# Patient Record
Sex: Male | Born: 1940 | Hispanic: No | Marital: Married
Health system: Southern US, Community
[De-identification: ages and names within clinical notes are randomized; demographics above are authoritative.]

---

## 2004-02-15 ENCOUNTER — Ambulatory Visit: Payer: Self-pay | Admitting: Otolaryngology

## 2004-05-12 ENCOUNTER — Ambulatory Visit: Payer: Self-pay

## 2004-10-19 ENCOUNTER — Ambulatory Visit: Payer: Self-pay

## 2004-11-22 ENCOUNTER — Ambulatory Visit: Payer: Self-pay | Admitting: Otolaryngology

## 2007-03-25 ENCOUNTER — Inpatient Hospital Stay: Payer: Self-pay | Admitting: Surgery

## 2008-12-27 ENCOUNTER — Emergency Department: Payer: Self-pay | Admitting: Emergency Medicine

## 2008-12-30 ENCOUNTER — Ambulatory Visit: Payer: Self-pay | Admitting: Urology

## 2009-01-07 ENCOUNTER — Ambulatory Visit: Payer: Self-pay | Admitting: Urology

## 2009-01-07 ENCOUNTER — Ambulatory Visit: Payer: Self-pay | Admitting: Internal Medicine

## 2009-01-14 ENCOUNTER — Ambulatory Visit: Payer: Self-pay | Admitting: Urology

## 2009-01-19 ENCOUNTER — Inpatient Hospital Stay: Payer: Self-pay | Admitting: Urology

## 2009-01-19 ENCOUNTER — Ambulatory Visit: Payer: Self-pay | Admitting: Urology

## 2009-01-26 ENCOUNTER — Ambulatory Visit: Payer: Self-pay | Admitting: Urology

## 2009-02-04 ENCOUNTER — Ambulatory Visit: Payer: Self-pay | Admitting: Urology

## 2009-02-18 ENCOUNTER — Ambulatory Visit: Payer: Self-pay | Admitting: Urology

## 2009-06-28 ENCOUNTER — Ambulatory Visit: Payer: Self-pay | Admitting: Urology

## 2009-08-27 ENCOUNTER — Ambulatory Visit: Payer: Self-pay | Admitting: Urology

## 2009-09-09 ENCOUNTER — Ambulatory Visit: Payer: Self-pay | Admitting: Urology

## 2009-09-11 ENCOUNTER — Emergency Department: Payer: Self-pay | Admitting: Emergency Medicine

## 2009-09-14 ENCOUNTER — Ambulatory Visit: Payer: Self-pay | Admitting: Urology

## 2009-09-20 ENCOUNTER — Ambulatory Visit: Payer: Self-pay | Admitting: Urology

## 2009-09-20 ENCOUNTER — Observation Stay: Payer: Self-pay | Admitting: Urology

## 2010-07-26 IMAGING — CR DG ABDOMEN 1V
1 series · 1 of 1 positions shown · non-contrast
Comparison: none

REASON FOR EXAM: nephrolithiasis
COMMENTS:

[view not recorded]
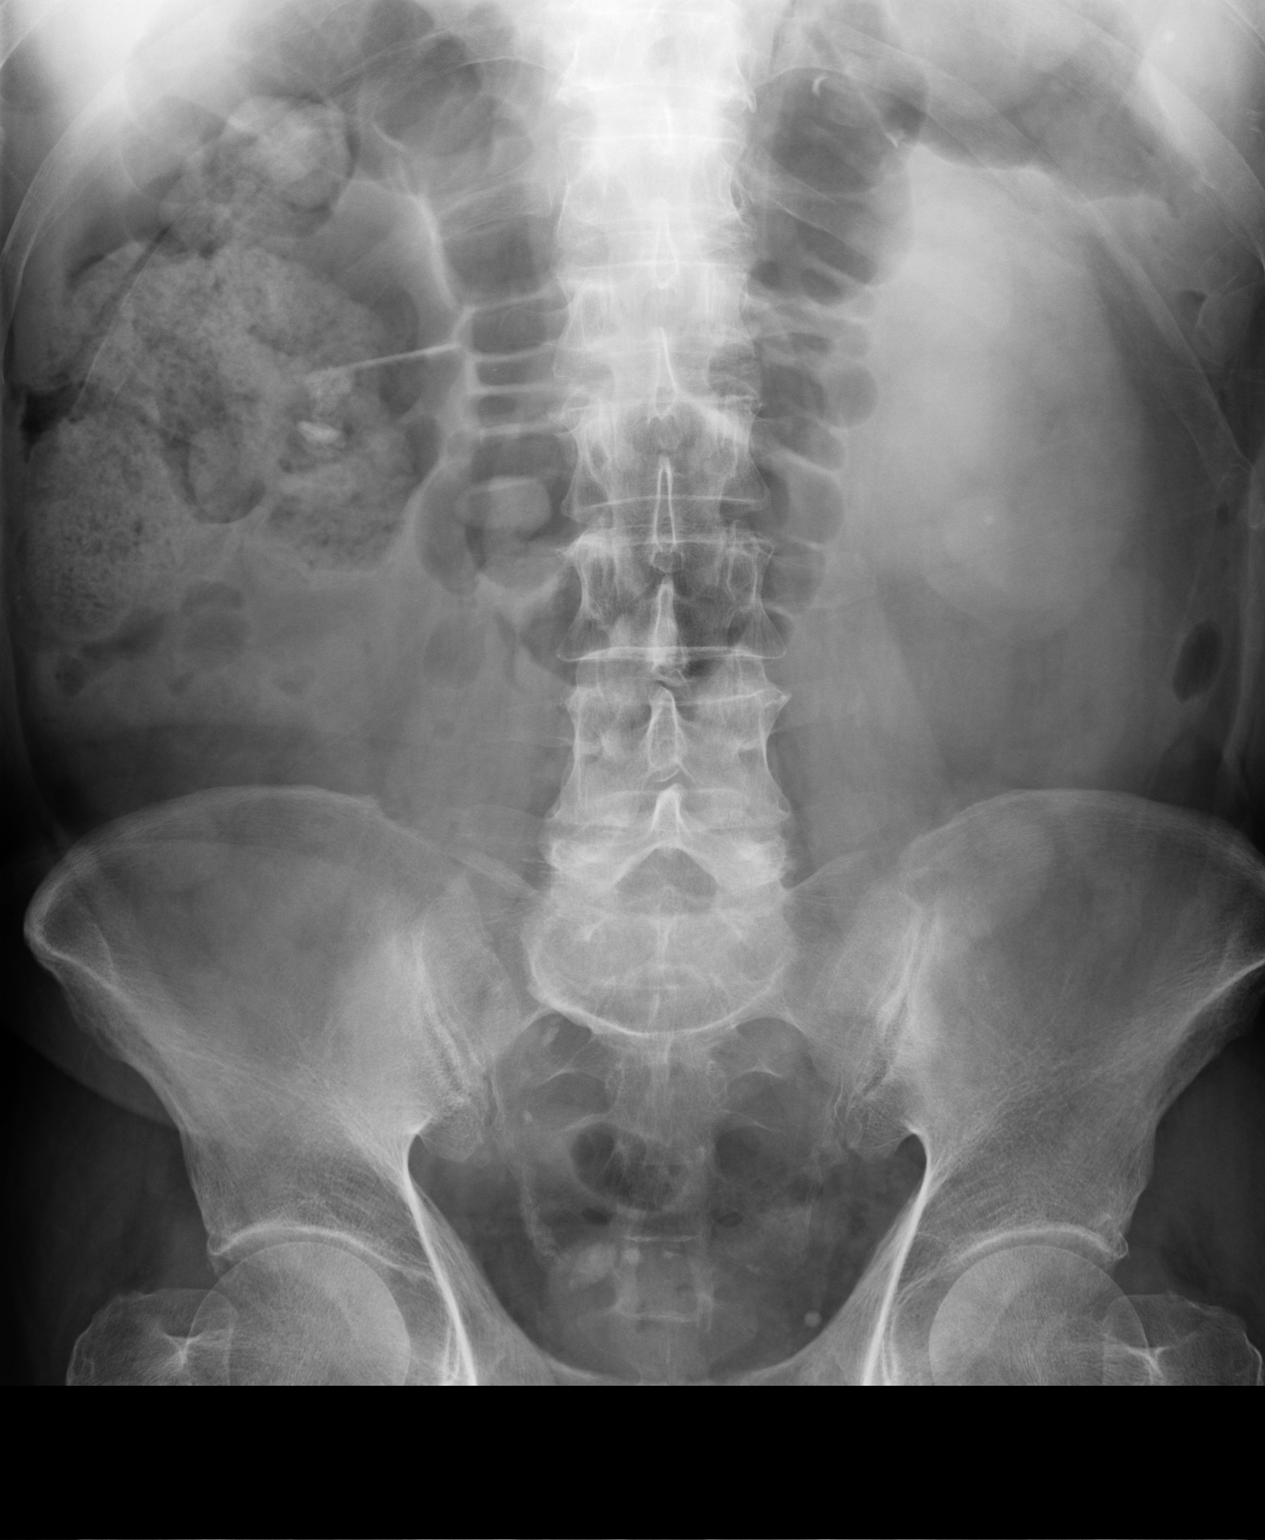

[1 of 1 positions shown; findings below may reference images not displayed]

PROCEDURE:     DXR - DXR KIDNEY URETER BLADDER  - January 19, 2009  [DATE]

RESULT:     Comparison is made to the study 14 January, 2009. There is
again demonstrated calcification projecting over the right kidney likely in
the renal pelvis region. This calcification may be in 2 parts currently.
There is a dense structure that lies adjacent to the L2 correction L2-3 disc
space on the right that may reflect density within the colon or could
reflect a urinary tract stone. There is radiodense material within the
pelvis and there may be Steinstrasse in the distal ureter.
IMPRESSION: There are calcifications that project over the right kidney
and along the expected course of the ureter. Some of these calcifications
lie may lie within bowel. There is likely Steinstrasse in the distal right
ureter. Further interpretation is deferred to Dr. Tiger.

## 2010-08-02 IMAGING — CR DG ABDOMEN 1V
1 series · 1 of 1 positions shown · non-contrast
Comparison: none

REASON FOR EXAM: nephrolithiasis 
COMMENTS:

[view not recorded]
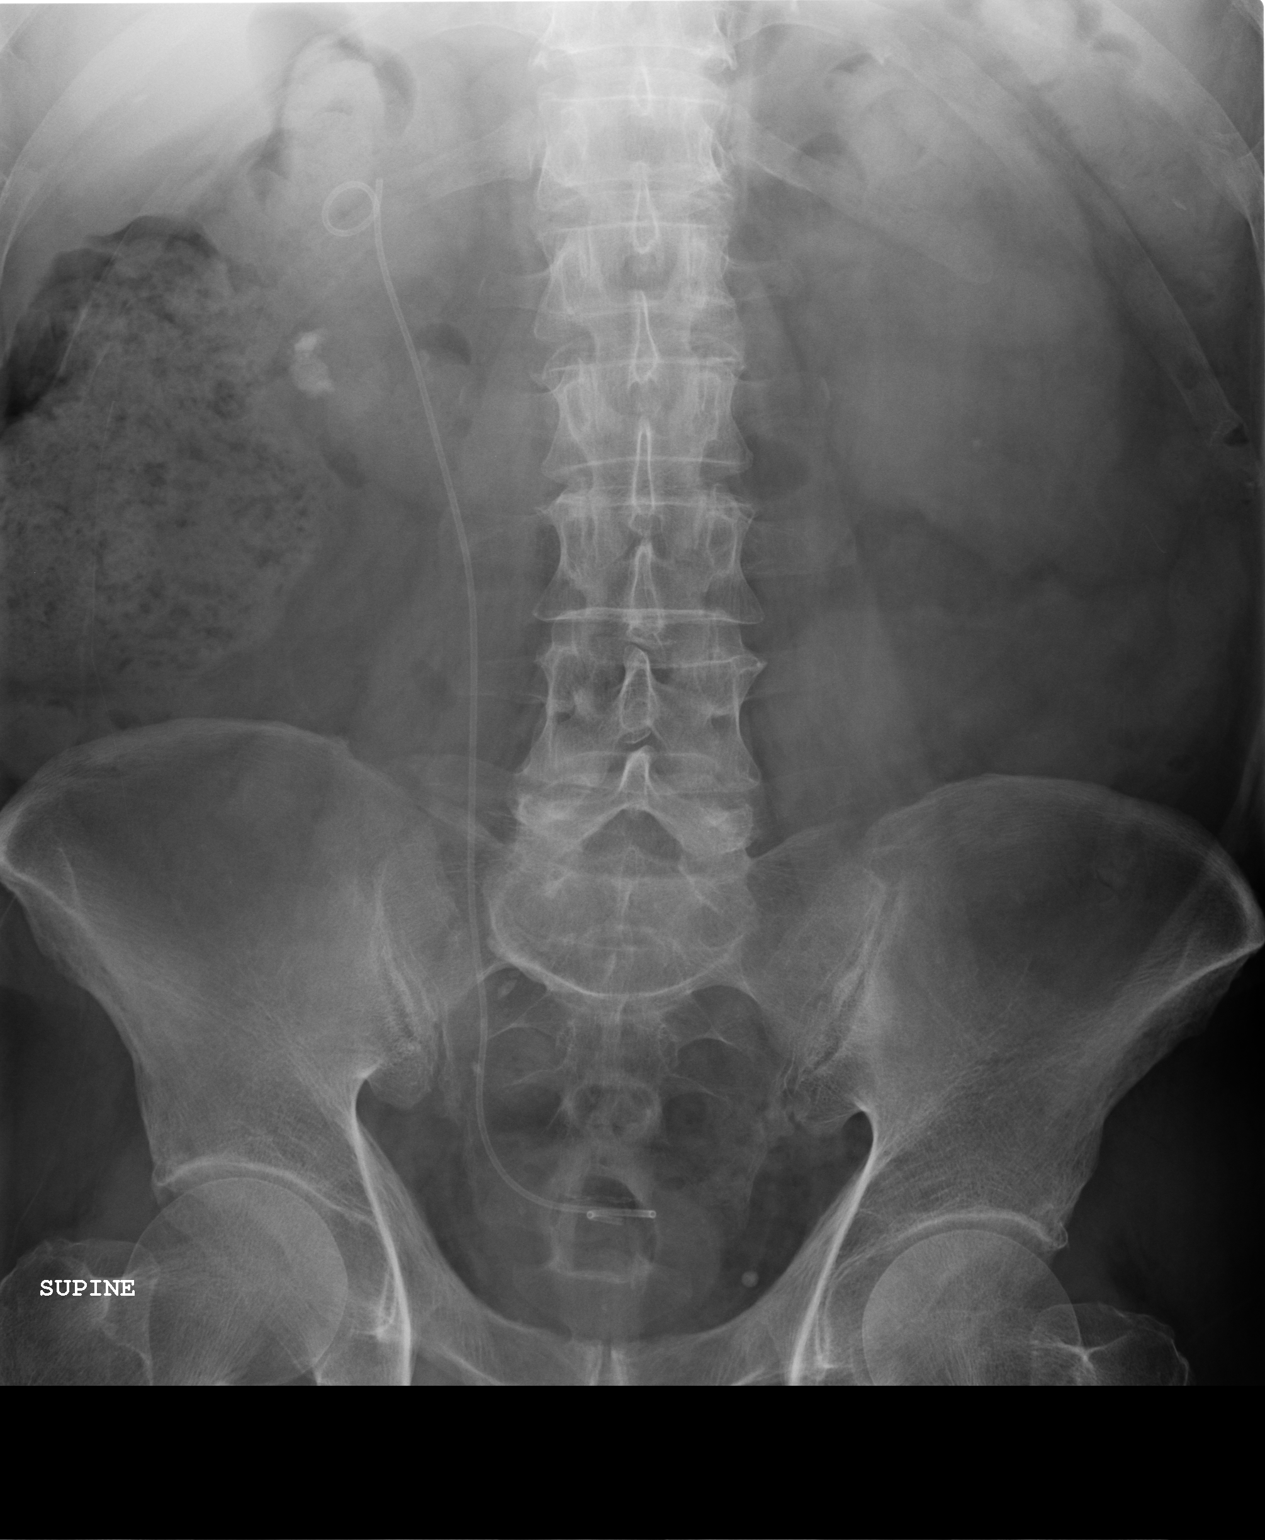

[1 of 1 positions shown; findings below may reference images not displayed]

PROCEDURE:     DXR - DXR KIDNEY URETER BLADDER  - January 26, 2009  [DATE]

RESULT:     Comparison is made to the study 19 January, 2009.

There has been interval placement of a double-J ureteral stent on the right.
A coarse calcification remains visible over the lower pole of the right
kidney. There is a phlebolith in the left aspect of the pelvis and there is
a calcification that overlies the pelvic portion of the stent on the right.
The area of increased density previously demonstrated in the expected
location of the lower ureter is not evident today and the tiny stone
fragments may have been removed.

There is approximately 2 mm diameter calcification in the lower pole of the
left kidney which appears stable.
IMPRESSION: There are bilateral kidney stones. A double-J ureteral
stent has been placed on the right. Further interpretation is deferred to
Dr. Bastl.

## 2010-10-31 ENCOUNTER — Ambulatory Visit: Payer: Self-pay | Admitting: Urology

## 2010-11-21 ENCOUNTER — Ambulatory Visit: Payer: Self-pay | Admitting: Urology

## 2010-11-29 ENCOUNTER — Ambulatory Visit: Payer: Self-pay | Admitting: Urology

## 2010-12-09 ENCOUNTER — Ambulatory Visit: Payer: Self-pay | Admitting: Urology

## 2010-12-16 ENCOUNTER — Ambulatory Visit: Payer: Self-pay | Admitting: Urology

## 2011-03-27 IMAGING — CR DG ABDOMEN 1V
1 series · 2 of 2 positions shown · non-contrast
Comparison: none

REASON FOR EXAM: nephrolithaisis
COMMENTS:

[Series 1: view not recorded · 0.17mm/px · 2 of 2 slices shown]
[im 1/2]
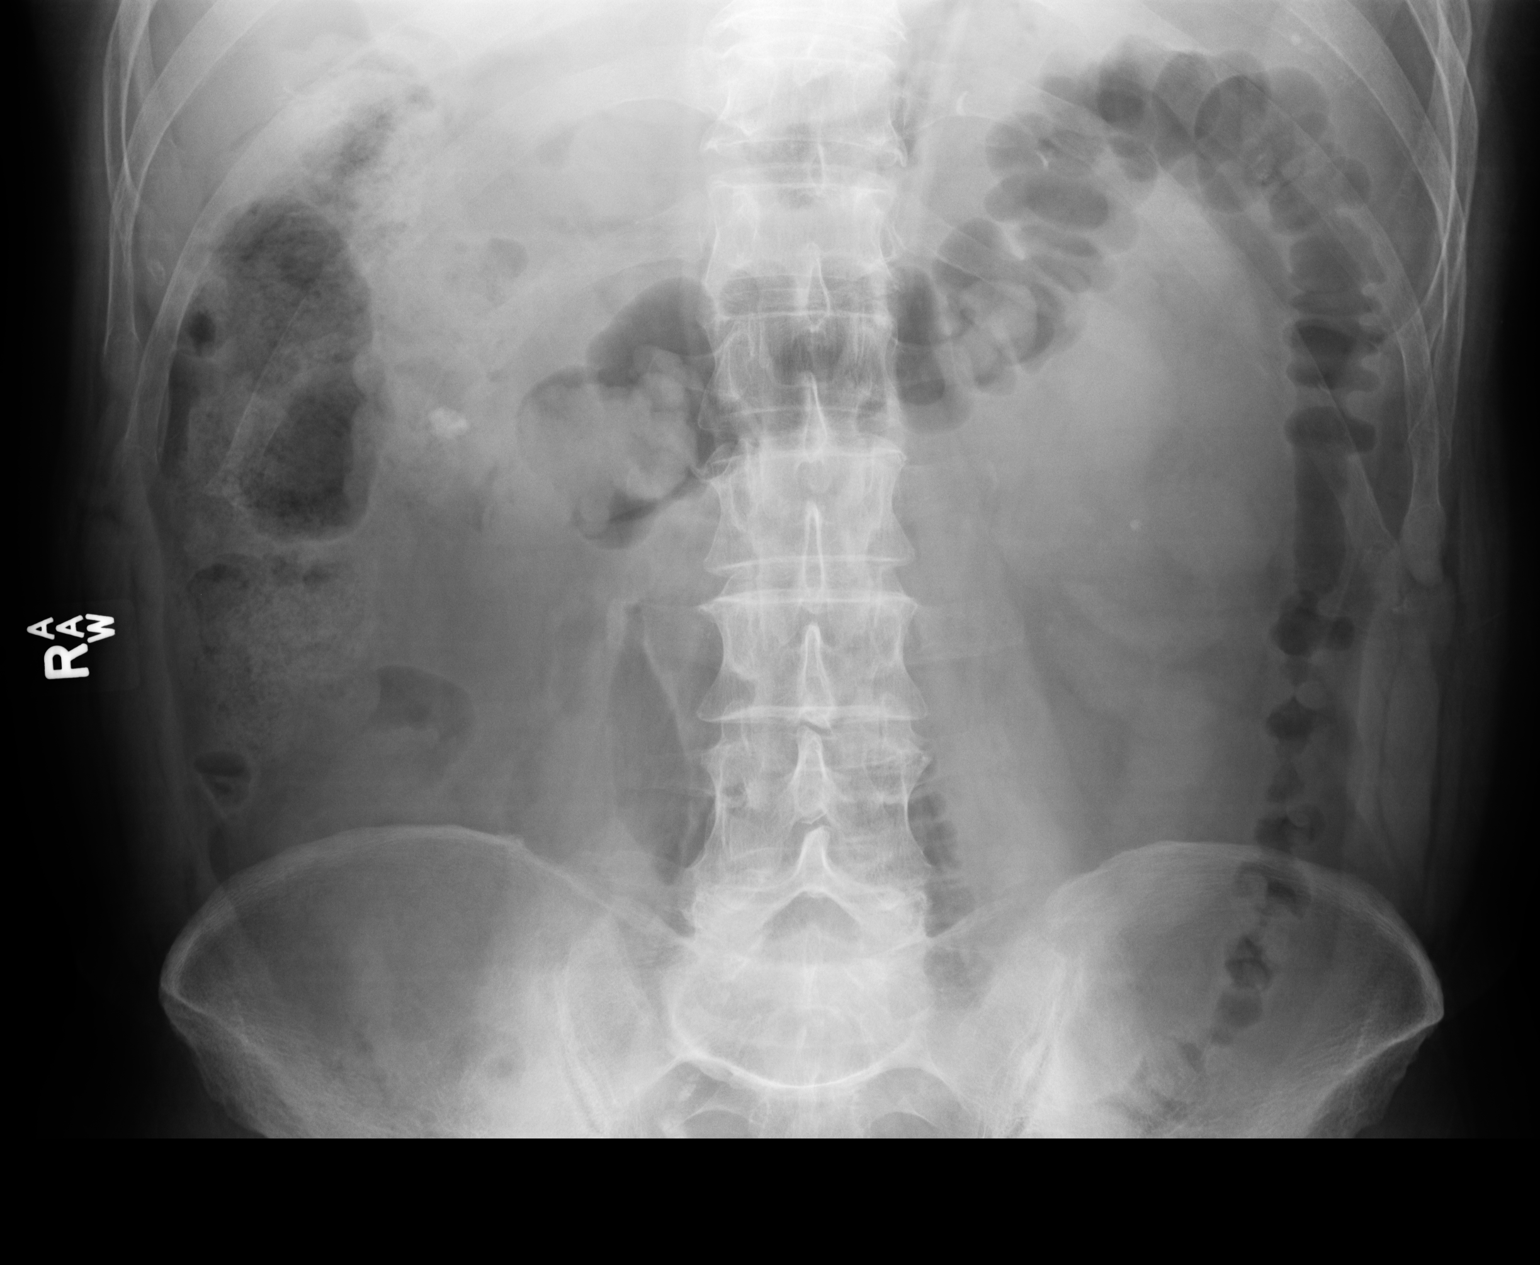
[im 2/2]
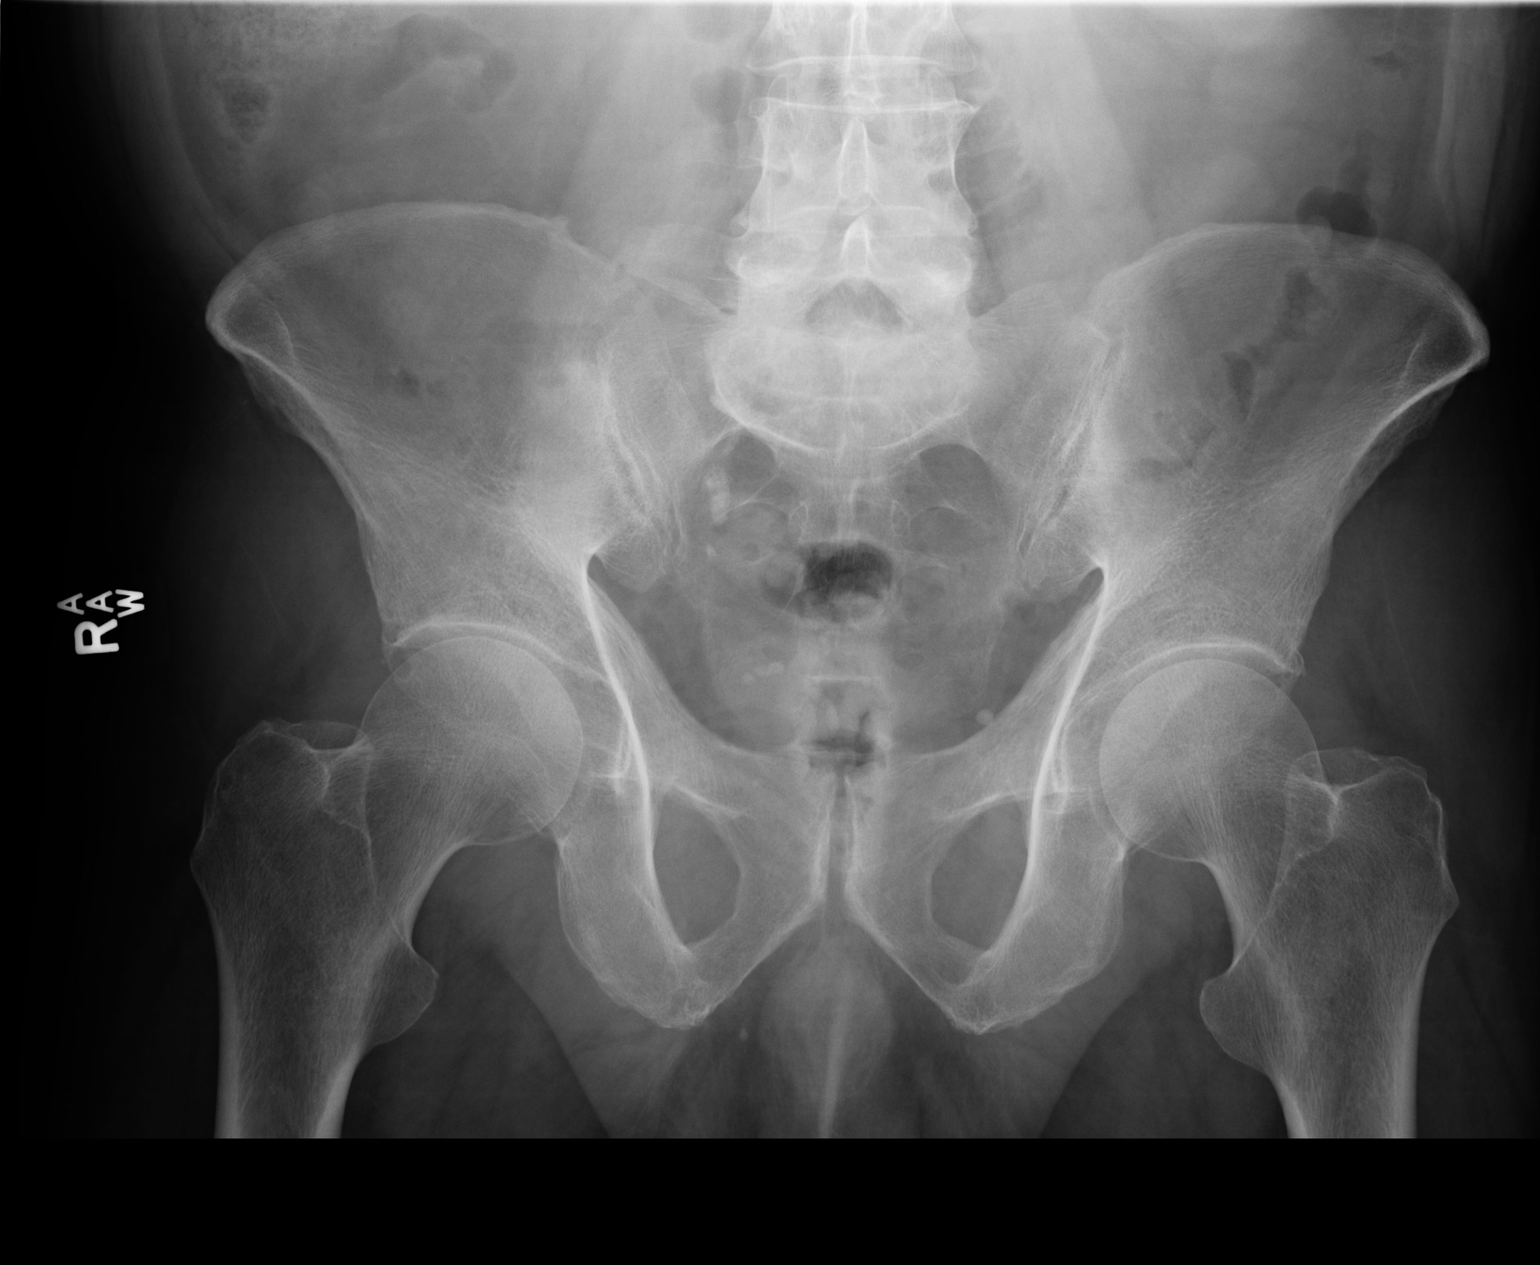

[2 of 2 positions shown; findings below may reference images not displayed]

PROCEDURE:     DXR - DXR KIDNEY URETER BLADDER  - September 20, 2009  [DATE]

RESULT:     Comparison is made to a prior exam of 09/14/2009. There is a 3 mm
calcification projected at the lower pole of the left kidney. On the right,
there is a 1.12 cm calcification visualized over the lower pole region. The
second large calcification on the right is not definitely identified within
the kidney. There are a few new densities in the right pelvis just above the
level of the ureterovesical junction. These are suspicious for stone
fragments.
IMPRESSION: 1. Bilateral nephrolithiasis.
2. There are densities noted low in the right pelvis compatible with distal
right ureterolithiasis.

## 2011-03-28 IMAGING — CR DG ABDOMEN 1V
1 series · 2 of 2 positions shown · non-contrast
Comparison: none

REASON FOR EXAM: right ureteral steinstrasse
COMMENTS:

[Series 1: view not recorded · 0.17mm/px · 2 of 2 slices shown]
[im 1/2]
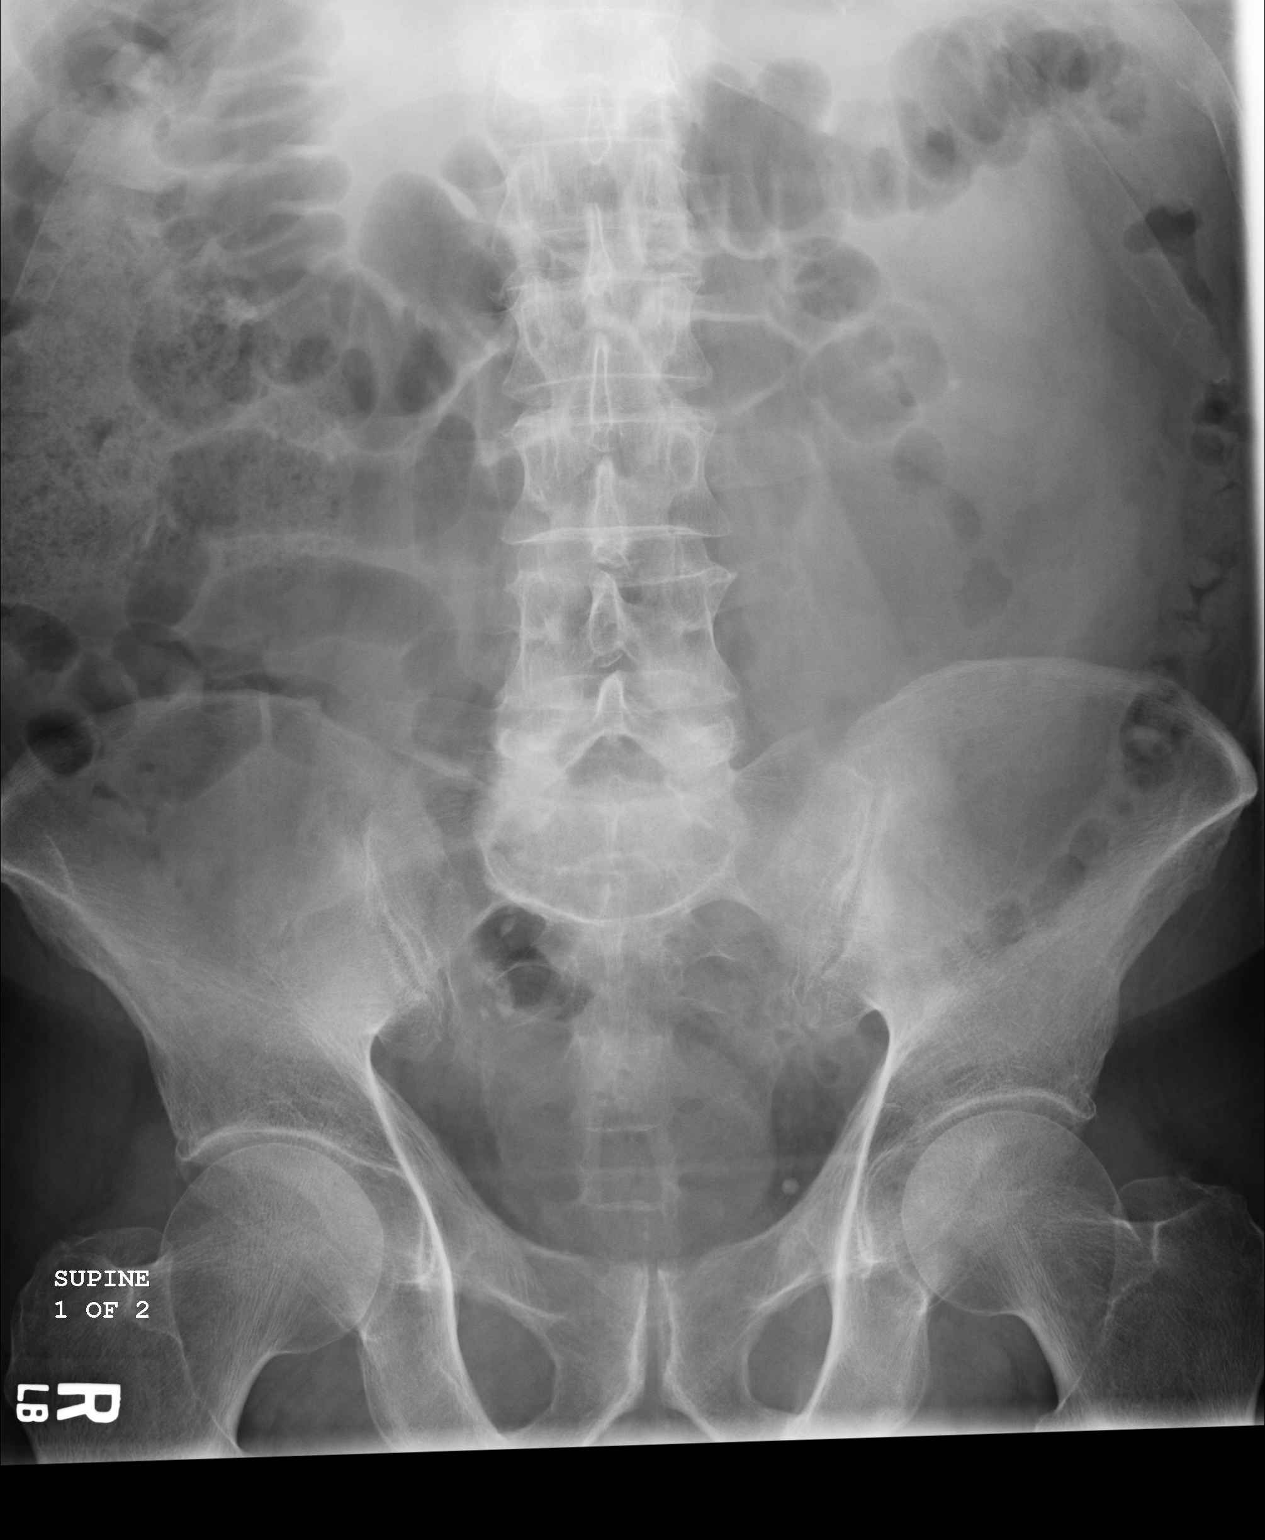
[im 2/2]
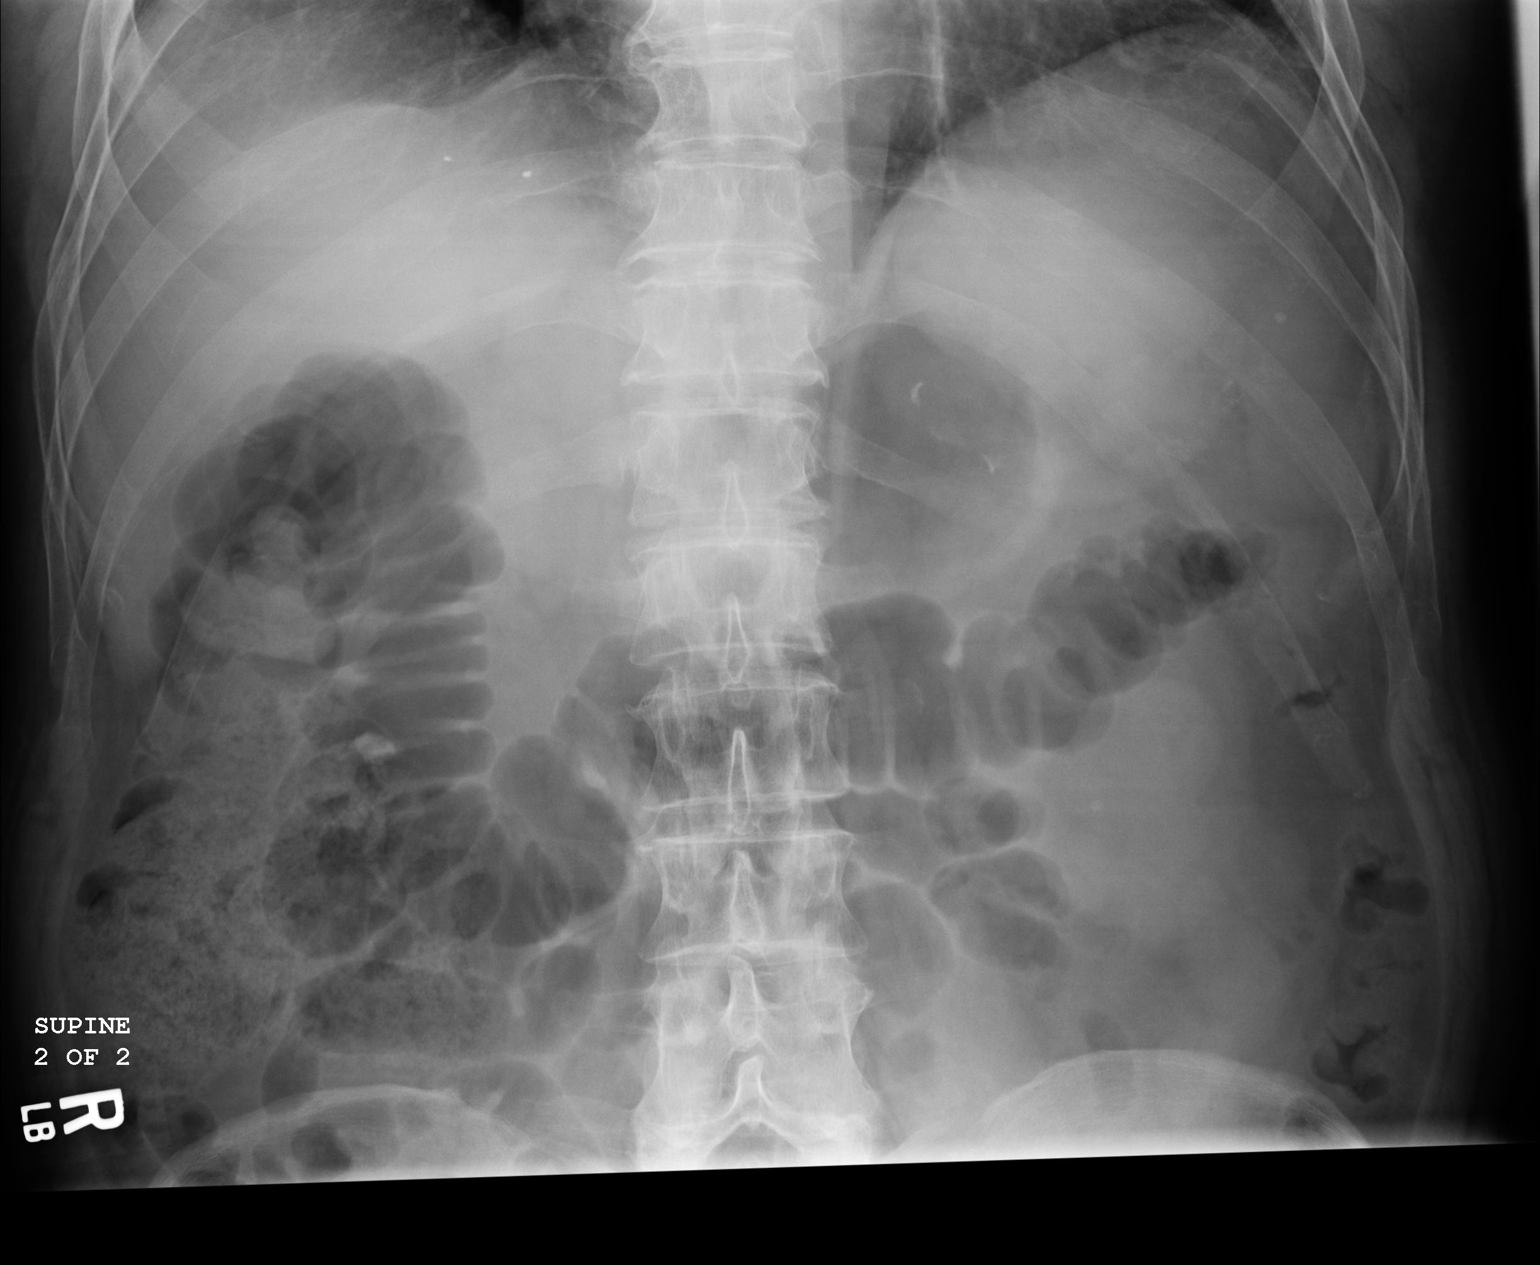

[2 of 2 positions shown; findings below may reference images not displayed]

PROCEDURE:     DXR - DXR KIDNEY URETER BLADDER  - September 21, 2009  [DATE]

RESULT:     Comparison is made to the study 20 September, 2009.

The bowel gas pattern is within the limits of normal. There is a stable
calcification measuring approximately 6 x 10 mm projecting over the right
kidney. There is a rounded 3 mm diameter calcification of the lower pole of
the left kidney. There is coarse calcification that projects over the first
and second sacral struts on the right which is stable.
IMPRESSION: There are bilateral kidney stones. I cannot exclude the
clinically suspected Steinstrasse in the lower right ureter.

## 2011-06-30 ENCOUNTER — Ambulatory Visit: Payer: Self-pay | Admitting: Urology

## 2012-06-14 IMAGING — CT CT STONE STUDY
1 of 2 series · 15 of 32 positions shown, 19 images · non-contrast
Comparison: none

REASON FOR EXAM: nephrolithiasis
COMMENTS:

PROCEDURE:     KCT - KCT ABDOMEN/PELVIS WO (STONE)  - December 09, 2010 [DATE]
RESULT:
TECHNIQUE: Noncontrast CT of the abdomen and pelvis is reconstructed at
mm slice thickness and compared to the previous exam of 12/27/2008.

[Series 2: stonelg 3.0 i40s 3 · axial · 0.83mm/px · z∈[-284,+140]mm · 15 of 159 slices shown, 19 images]
[im 12/159  soft-tissue]
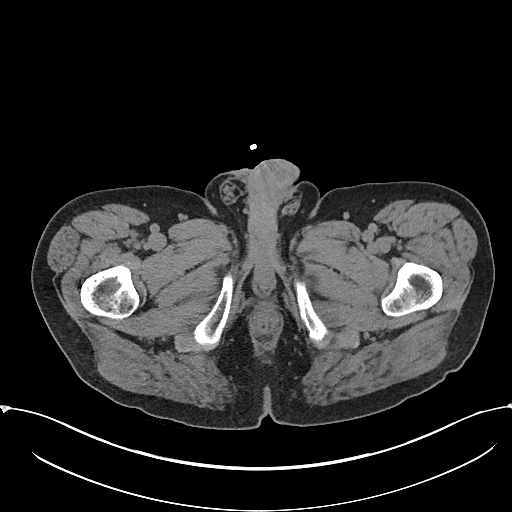
[im 12/159  bone]
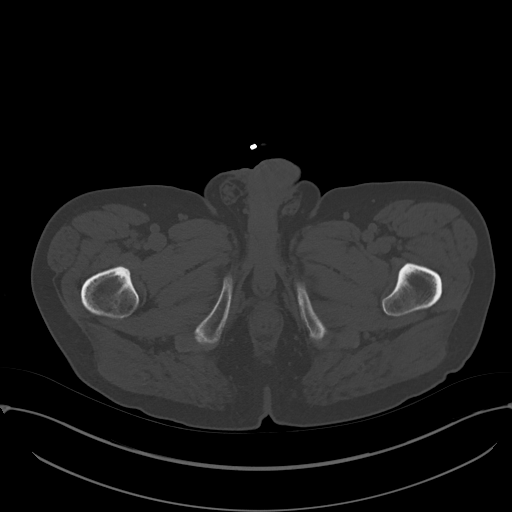
[im 24/159  soft-tissue]
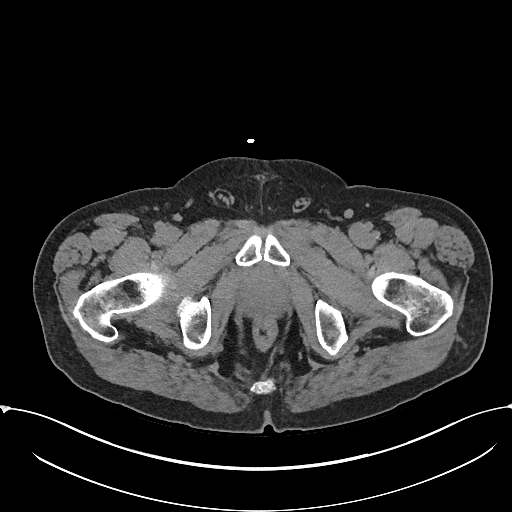
[im 36/159  soft-tissue]
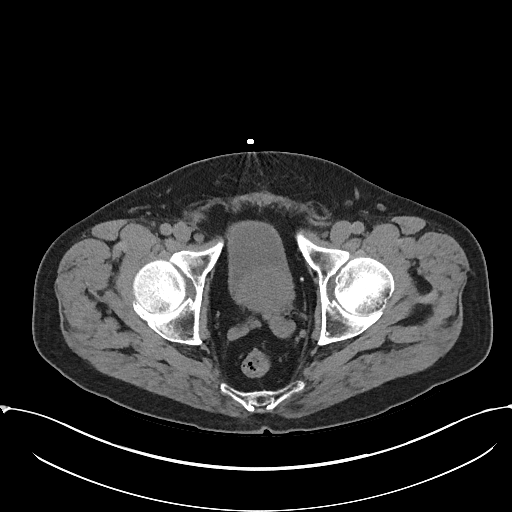
[im 47/159  soft-tissue]
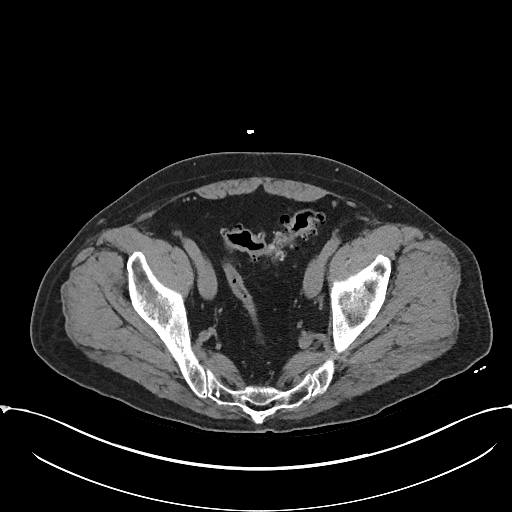
[im 59/159  soft-tissue]
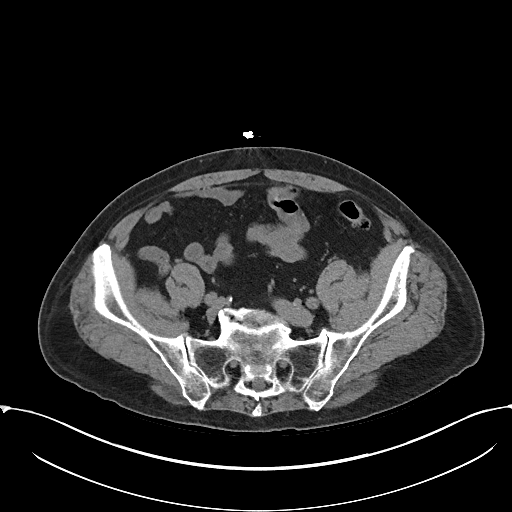
[im 71/159  soft-tissue]
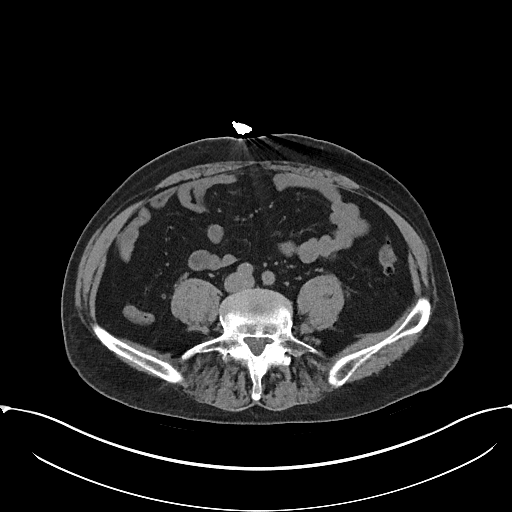
[im 82/159  soft-tissue]
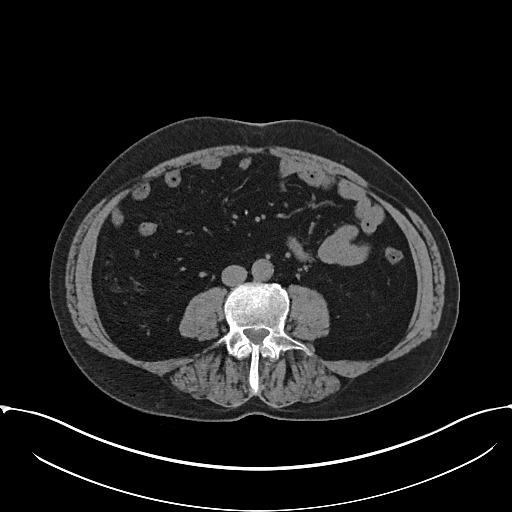
[im 94/159  soft-tissue]
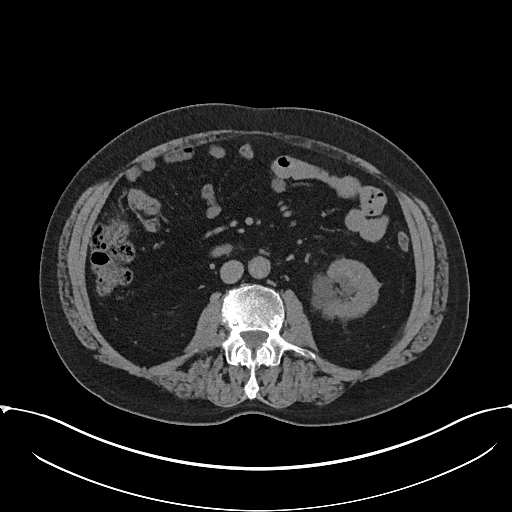
[im 106/159  soft-tissue]
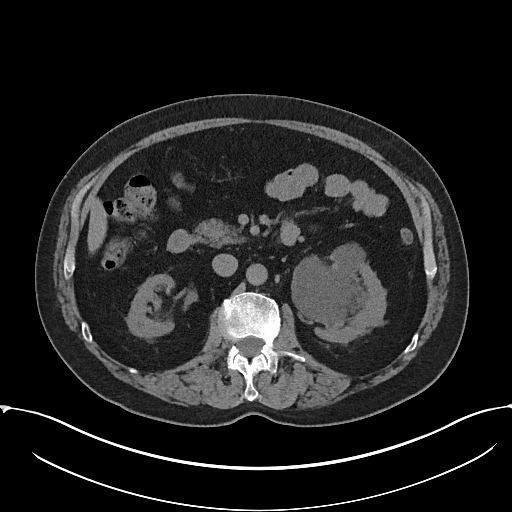
[im 106/159  bone]
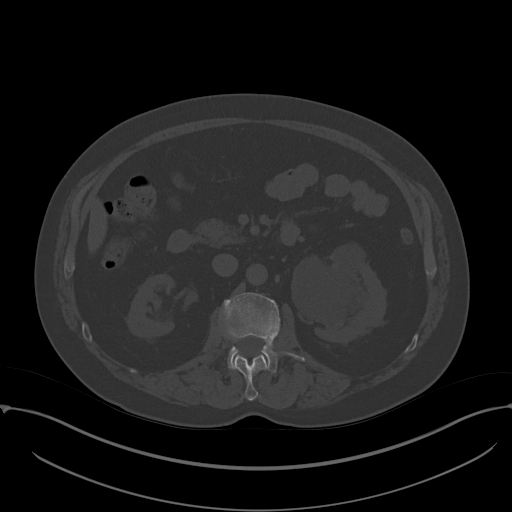
[im 118/159  soft-tissue]
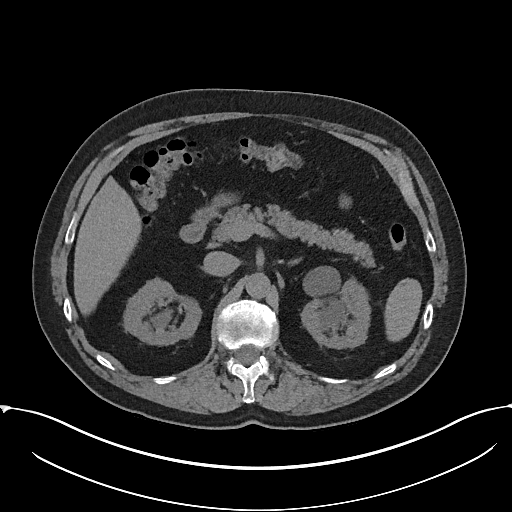
[im 129/159  soft-tissue]
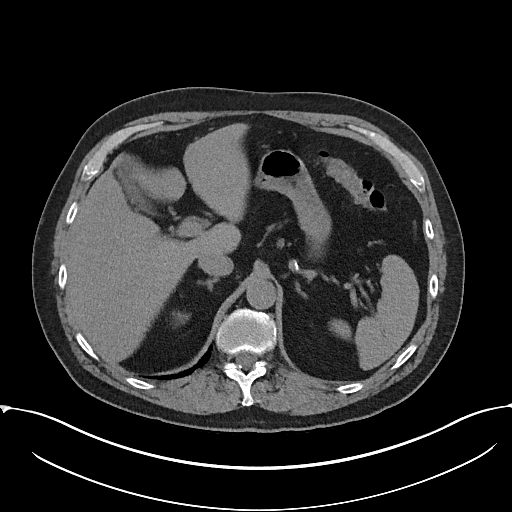
[im 135/159  lung]
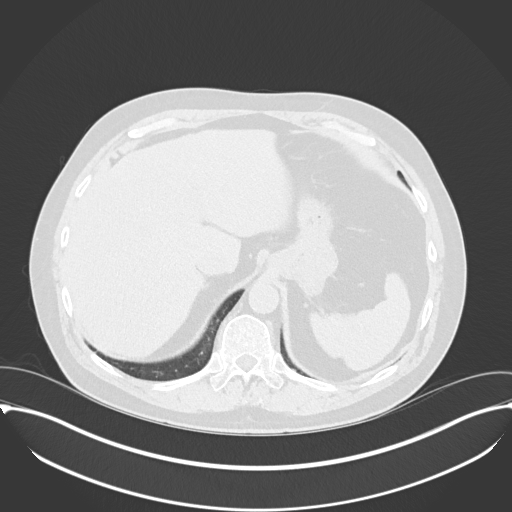
[im 141/159  soft-tissue]
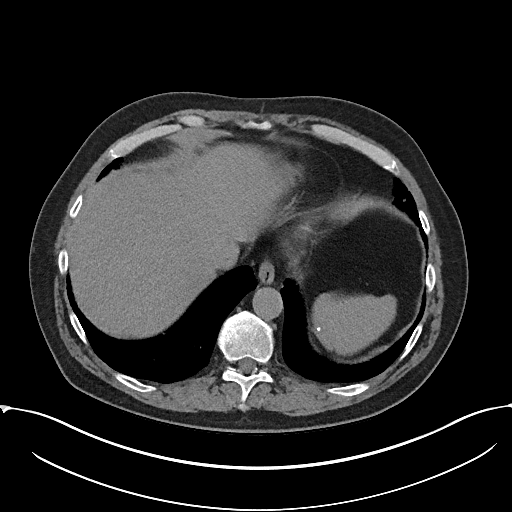
[im 141/159  lung]
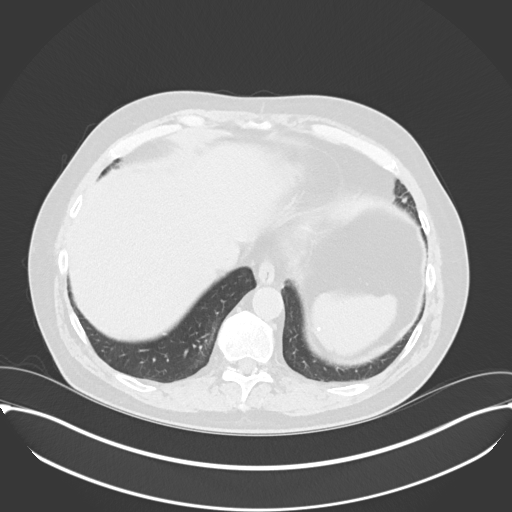
[im 147/159  lung]
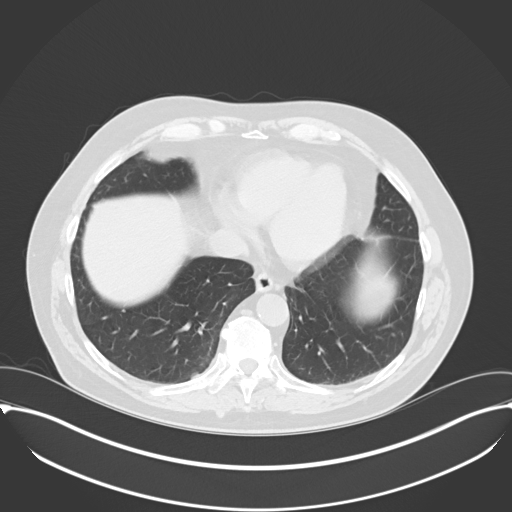
[im 153/159  soft-tissue]
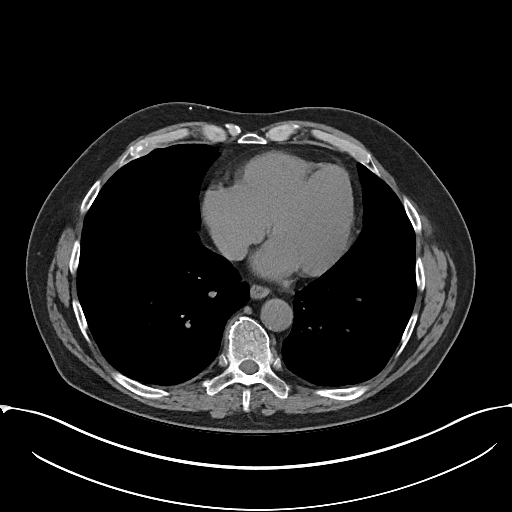
[im 153/159  lung]
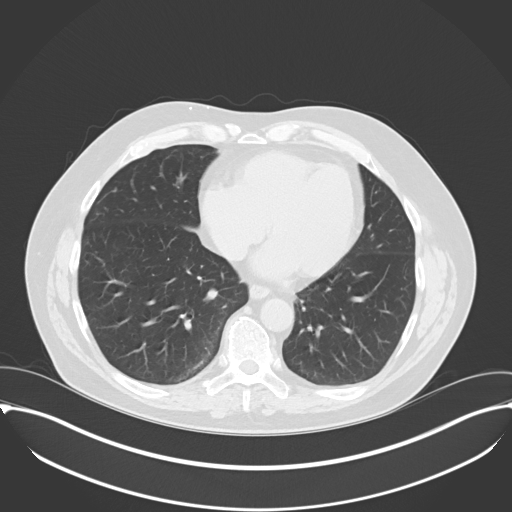

[15 of 32 positions shown; findings below may reference images not displayed]

FINDINGS: Bilateral nephrolithiasis is present with the largest stone in the
lower pole collecting system of the right kidney which measures as much as
10 mm in diameter. There is either hydronephrosis or prominent parapelvic
cysts in the left kidney. Renal cortical thinning is present bilaterally. No
radiopaque gallstones are evident. Atherosclerotic calcification is present
in the aorta without aneurysm. Colonic diverticular disease is present,
especially in the sigmoid with mild thickening of the wall of the colon. No
acute infectious process is appreciated. The prostate is significantly
enlarged but stable compared to the previous exam and contains some small
calcifications. The liver, spleen, pancreas, adrenal glands and gallbladder
appear to be within normal limits for a noncontrast exam. Mild bony
degenerative changes are seen in the hips. Punctate granulomatous
calcifications are seen within the liver and spleen.
IMPRESSION: 1.  Cortical thinning of both kidneys. Bilateral nephrolithiasis. Prominence
of the left renal collecting system consistent with hydronephrosis. The
possibility of numerous parapelvic cysts exists.
2.  Atherosclerotic disease.
3.  Probable punctate granulomatous calcifications in the liver and spleen.
4.  Colonic diverticular disease. There is mild thickening of the wall of
the sigmoid colon which could be secondary to chronic diverticulosis.
Correlate with sigmoidoscopy findings to exclude developing malignancy in
the sigmoid colon.

## 2012-07-01 ENCOUNTER — Ambulatory Visit: Payer: Self-pay | Admitting: Urology

## 2012-08-26 ENCOUNTER — Other Ambulatory Visit: Payer: Self-pay

## 2012-08-26 LAB — CK-MB: CK-MB: 0.8 ng/mL (ref 0.5–3.6)

## 2012-08-26 LAB — CK: CK, Total: 67 U/L (ref 35–232)

## 2013-04-24 ENCOUNTER — Ambulatory Visit: Payer: Self-pay

## 2013-06-13 ENCOUNTER — Emergency Department: Payer: Self-pay | Admitting: Emergency Medicine

## 2013-06-13 LAB — COMPREHENSIVE METABOLIC PANEL
ALK PHOS: 85 U/L
Albumin: 3.1 g/dL — ABNORMAL LOW (ref 3.4–5.0)
Anion Gap: 8 (ref 7–16)
BUN: 12 mg/dL (ref 7–18)
Bilirubin,Total: 0.5 mg/dL (ref 0.2–1.0)
Calcium, Total: 8.7 mg/dL (ref 8.5–10.1)
Chloride: 106 mmol/L (ref 98–107)
Co2: 26 mmol/L (ref 21–32)
Creatinine: 1.07 mg/dL (ref 0.60–1.30)
EGFR (African American): >60
EGFR (Non-African Amer.): >60
Glucose: 103 mg/dL — ABNORMAL HIGH (ref 65–99)
OSMOLALITY: 279 (ref 275–301)
Potassium: 3.6 mmol/L (ref 3.5–5.1)
SGOT(AST): 22 U/L (ref 15–37)
SGPT (ALT): 29 U/L (ref 12–78)
Sodium: 140 mmol/L (ref 136–145)
Total Protein: 7.2 g/dL (ref 6.4–8.2)

## 2013-06-13 LAB — CBC
HCT: 46.9 % (ref 40.0–52.0)
HGB: 16.1 g/dL (ref 13.0–18.0)
MCH: 31.4 pg (ref 26.0–34.0)
MCHC: 34.2 g/dL (ref 32.0–36.0)
MCV: 92 fL (ref 80–100)
PLATELETS: 232 10*3/uL (ref 150–440)
RBC: 5.12 10*6/uL (ref 4.40–5.90)
RDW: 13.6 % (ref 11.5–14.5)
WBC: 9.8 10*3/uL (ref 3.8–10.6)

## 2013-08-20 ENCOUNTER — Ambulatory Visit: Payer: Self-pay | Admitting: Vascular Surgery

## 2014-06-16 DEATH — deceased

## 2014-08-08 NOTE — Op Note (Signed)
PATIENT NAME:  Samuel Lozano, Samuel Lozano MR#:  [id number] DATE OF BIRTH:  [birth year]  DATE OF PROCEDURE:  08/20/2013  PREOPERATIVE DIAGNOSIS:  1.  Deep vein thrombosis with hematuria.    POSTOPERATIVE DIAGNOSIS:   1.  Deep vein thrombosis with hematuria.  PROCEDURES PERFORMED: 1.  Ultrasound guidance for vascular access to right femoral vein.  2.  Catheter placement into inferior vena cava.  3.  Inferior venacavogram.  4.  Placement of a Bard Denali IVC filter.   SURGEON: Festus BarrenJason Dew, M.D.  ANESTHESIA:  Local with sedation.   ESTIMATED BLOOD LOSS: Minimal.   FLUOROSCOPY TIME:  Less than 1 minute.   CONTRAST USED:  15 mL.   INDICATION FOR PROCEDURE:  This is a 74 year old gentleman with about a 2 month history of right lower extremity DVT. He has been on anticoagulation but has had persistent hematuria and this has required cessation of anticoagulation; the only viable means of stopping the hematuria and avoiding bleeding.  Risks and benefits were discussed. Informed consent was obtained.   DESCRIPTION OF PROCEDURE:  The patient was brought to the vascular suite. The skin is sterilely prepped and draped and a sterile surgical field was created. The right femoral vein was accessed under direct ultrasound guidance without difficulty with a Seldinger needle and a J-wire was then placed. After skin nick and dilatation, the delivery sheath was placed into the inferior vena cava and an inferior venacavogram was performed. This demonstrated a patent IVC with the level of the renal veins at L1. The filter was then deployed into the inferior vena cava at the level of L2.  The delivery sheath was then removed. Pressure was held. Sterile dressing was placed. The patient tolerated the procedure well and was taken to the recovery room in stable condition.     ____________________________ Annice NeedyJason S. Dew, MD jsd:dmm D: 08/20/2013 14:38:00 ET T: 08/20/2013 22:39:25 ET JOB#: [id number]  cc: Steele SizerMark A. Crissman,  MD Annice NeedyJason S. Dew, MD, <Dictator>  D  Annice NeedyJASON S DEW MD ELECTRONICALLY SIGNED 08/21/2013 14:26
